# Patient Record
Sex: Female | Born: 1960 | Race: White | Hispanic: No | Marital: Married | Smoking: Never smoker
Health system: Southern US, Community
[De-identification: ages and names within clinical notes are randomized; demographics above are authoritative.]

## PROBLEM LIST (undated history)

## (undated) DIAGNOSIS — I10 Essential (primary) hypertension: Secondary | ICD-10-CM

## (undated) DIAGNOSIS — E119 Type 2 diabetes mellitus without complications: Secondary | ICD-10-CM

## (undated) HISTORY — PX: ABDOMINAL HYSTERECTOMY: SHX81

---

## 1998-06-01 ENCOUNTER — Other Ambulatory Visit: Admission: RE | Admit: 1998-06-01 | Discharge: 1998-06-01 | Payer: Self-pay | Admitting: Gynecology

## 1999-05-25 ENCOUNTER — Other Ambulatory Visit: Admission: RE | Admit: 1999-05-25 | Discharge: 1999-05-25 | Payer: Self-pay | Admitting: Gynecology

## 2001-06-12 ENCOUNTER — Other Ambulatory Visit: Admission: RE | Admit: 2001-06-12 | Discharge: 2001-06-12 | Payer: Self-pay | Admitting: Gynecology

## 2001-07-16 ENCOUNTER — Encounter: Payer: Self-pay | Admitting: Gynecology

## 2001-07-16 ENCOUNTER — Encounter: Admission: RE | Admit: 2001-07-16 | Discharge: 2001-07-16 | Payer: Self-pay | Admitting: Gynecology

## 2002-01-03 ENCOUNTER — Inpatient Hospital Stay (HOSPITAL_COMMUNITY): Admission: EM | Admit: 2002-01-03 | Discharge: 2002-01-04 | Payer: Self-pay | Admitting: Gynecology

## 2002-04-25 ENCOUNTER — Encounter: Payer: Self-pay | Admitting: Gynecology

## 2002-04-25 ENCOUNTER — Encounter: Admission: RE | Admit: 2002-04-25 | Discharge: 2002-04-25 | Payer: Self-pay | Admitting: Gynecology

## 2002-07-08 ENCOUNTER — Other Ambulatory Visit: Admission: RE | Admit: 2002-07-08 | Discharge: 2002-07-08 | Payer: Self-pay | Admitting: Gynecology

## 2002-07-22 ENCOUNTER — Encounter: Payer: Self-pay | Admitting: Gynecology

## 2002-07-22 ENCOUNTER — Encounter: Admission: RE | Admit: 2002-07-22 | Discharge: 2002-07-22 | Payer: Self-pay | Admitting: Gynecology

## 2003-07-29 ENCOUNTER — Other Ambulatory Visit: Admission: RE | Admit: 2003-07-29 | Discharge: 2003-07-29 | Payer: Self-pay | Admitting: Gynecology

## 2003-09-09 ENCOUNTER — Encounter: Admission: RE | Admit: 2003-09-09 | Discharge: 2003-09-09 | Payer: Self-pay | Admitting: Gynecology

## 2004-09-21 ENCOUNTER — Encounter: Admission: RE | Admit: 2004-09-21 | Discharge: 2004-09-21 | Payer: Self-pay | Admitting: Gynecology

## 2008-03-22 ENCOUNTER — Emergency Department: Payer: Self-pay | Admitting: Emergency Medicine

## 2011-01-04 ENCOUNTER — Other Ambulatory Visit: Payer: Self-pay | Admitting: *Deleted

## 2011-01-04 DIAGNOSIS — Z1231 Encounter for screening mammogram for malignant neoplasm of breast: Secondary | ICD-10-CM

## 2011-01-13 ENCOUNTER — Ambulatory Visit
Admission: RE | Admit: 2011-01-13 | Discharge: 2011-01-13 | Disposition: A | Discharge status: Home / Self Care | Payer: BLUE CROSS/BLUE SHIELD | Source: Ambulatory Visit | Attending: *Deleted | Admitting: *Deleted

## 2011-01-13 DIAGNOSIS — Z1231 Encounter for screening mammogram for malignant neoplasm of breast: Secondary | ICD-10-CM

## 2011-12-06 ENCOUNTER — Other Ambulatory Visit: Payer: Self-pay | Admitting: Family Medicine

## 2011-12-06 DIAGNOSIS — Z1231 Encounter for screening mammogram for malignant neoplasm of breast: Secondary | ICD-10-CM

## 2012-01-16 ENCOUNTER — Ambulatory Visit
Admission: RE | Admit: 2012-01-16 | Discharge: 2012-01-16 | Disposition: A | Discharge status: Home / Self Care | Payer: BC Managed Care – PPO | Source: Ambulatory Visit | Attending: Family Medicine | Admitting: Family Medicine

## 2012-01-16 DIAGNOSIS — Z1231 Encounter for screening mammogram for malignant neoplasm of breast: Secondary | ICD-10-CM

## 2012-10-09 ENCOUNTER — Other Ambulatory Visit: Payer: Self-pay | Admitting: Family Medicine

## 2012-10-09 DIAGNOSIS — Z1231 Encounter for screening mammogram for malignant neoplasm of breast: Secondary | ICD-10-CM

## 2013-01-22 ENCOUNTER — Ambulatory Visit
Admission: RE | Admit: 2013-01-22 | Discharge: 2013-01-22 | Disposition: A | Discharge status: Home / Self Care | Payer: PRIVATE HEALTH INSURANCE | Source: Ambulatory Visit | Attending: Family Medicine | Admitting: Family Medicine

## 2013-01-22 DIAGNOSIS — Z1231 Encounter for screening mammogram for malignant neoplasm of breast: Secondary | ICD-10-CM

## 2013-12-16 ENCOUNTER — Other Ambulatory Visit: Payer: Self-pay

## 2013-12-16 DIAGNOSIS — Z1231 Encounter for screening mammogram for malignant neoplasm of breast: Secondary | ICD-10-CM

## 2014-01-27 ENCOUNTER — Encounter (INDEPENDENT_AMBULATORY_CARE_PROVIDER_SITE_OTHER): Payer: Self-pay

## 2014-01-27 ENCOUNTER — Ambulatory Visit
Admission: RE | Admit: 2014-01-27 | Discharge: 2014-01-27 | Disposition: A | Discharge status: Home / Self Care | Payer: Self-pay | Source: Ambulatory Visit

## 2014-01-27 DIAGNOSIS — Z1231 Encounter for screening mammogram for malignant neoplasm of breast: Secondary | ICD-10-CM

## 2014-12-08 ENCOUNTER — Other Ambulatory Visit: Payer: Self-pay

## 2014-12-08 DIAGNOSIS — Z1231 Encounter for screening mammogram for malignant neoplasm of breast: Secondary | ICD-10-CM

## 2015-01-30 ENCOUNTER — Ambulatory Visit
Admission: RE | Admit: 2015-01-30 | Discharge: 2015-01-30 | Disposition: A | Discharge status: Home / Self Care | Payer: PRIVATE HEALTH INSURANCE | Source: Ambulatory Visit

## 2015-01-30 DIAGNOSIS — Z1231 Encounter for screening mammogram for malignant neoplasm of breast: Secondary | ICD-10-CM

## 2016-02-04 ENCOUNTER — Other Ambulatory Visit: Payer: Self-pay | Admitting: Nurse Practitioner

## 2016-02-04 DIAGNOSIS — Z1231 Encounter for screening mammogram for malignant neoplasm of breast: Secondary | ICD-10-CM

## 2016-02-08 ENCOUNTER — Ambulatory Visit: Admission: RE | Admit: 2016-02-08 | Payer: PRIVATE HEALTH INSURANCE | Source: Ambulatory Visit

## 2016-02-15 ENCOUNTER — Ambulatory Visit
Admission: RE | Admit: 2016-02-15 | Discharge: 2016-02-15 | Disposition: A | Discharge status: Home / Self Care | Payer: Managed Care, Other (non HMO) | Source: Ambulatory Visit | Attending: Nurse Practitioner | Admitting: Nurse Practitioner

## 2016-02-15 DIAGNOSIS — Z1231 Encounter for screening mammogram for malignant neoplasm of breast: Secondary | ICD-10-CM | POA: Diagnosis present

## 2017-12-29 ENCOUNTER — Other Ambulatory Visit: Payer: Self-pay | Admitting: Allergy

## 2017-12-29 ENCOUNTER — Ambulatory Visit
Admission: RE | Admit: 2017-12-29 | Discharge: 2017-12-29 | Disposition: A | Discharge status: Home / Self Care | Payer: No Typology Code available for payment source | Source: Ambulatory Visit | Attending: Allergy | Admitting: Allergy

## 2017-12-29 DIAGNOSIS — J209 Acute bronchitis, unspecified: Secondary | ICD-10-CM

## 2018-05-11 ENCOUNTER — Other Ambulatory Visit: Payer: Self-pay | Admitting: Nurse Practitioner

## 2018-05-11 DIAGNOSIS — R609 Edema, unspecified: Secondary | ICD-10-CM

## 2018-05-15 ENCOUNTER — Ambulatory Visit
Admission: RE | Admit: 2018-05-15 | Discharge: 2018-05-15 | Disposition: A | Discharge status: Home / Self Care | Payer: No Typology Code available for payment source | Source: Ambulatory Visit | Attending: Nurse Practitioner | Admitting: Nurse Practitioner

## 2018-05-15 DIAGNOSIS — R609 Edema, unspecified: Secondary | ICD-10-CM

## 2018-05-18 ENCOUNTER — Other Ambulatory Visit: Payer: No Typology Code available for payment source

## 2018-05-23 ENCOUNTER — Ambulatory Visit
Admission: EM | Admit: 2018-05-23 | Discharge: 2018-05-23 | Disposition: A | Discharge status: Home / Self Care | Payer: No Typology Code available for payment source | Attending: Emergency Medicine | Admitting: Emergency Medicine

## 2018-05-23 ENCOUNTER — Other Ambulatory Visit: Payer: Self-pay

## 2018-05-23 ENCOUNTER — Encounter: Payer: Self-pay | Admitting: Emergency Medicine

## 2018-05-23 ENCOUNTER — Ambulatory Visit (INDEPENDENT_AMBULATORY_CARE_PROVIDER_SITE_OTHER)
Admit: 2018-05-23 | Discharge: 2018-05-23 | Disposition: A | Discharge status: Home / Self Care | Payer: No Typology Code available for payment source | Attending: Emergency Medicine | Admitting: Emergency Medicine

## 2018-05-23 DIAGNOSIS — N200 Calculus of kidney: Secondary | ICD-10-CM | POA: Diagnosis not present

## 2018-05-23 DIAGNOSIS — R000 Tachycardia, unspecified: Secondary | ICD-10-CM

## 2018-05-23 DIAGNOSIS — M545 Low back pain: Secondary | ICD-10-CM | POA: Diagnosis not present

## 2018-05-23 DIAGNOSIS — J90 Pleural effusion, not elsewhere classified: Secondary | ICD-10-CM | POA: Diagnosis not present

## 2018-05-23 HISTORY — DX: Essential (primary) hypertension: I10

## 2018-05-23 HISTORY — DX: Type 2 diabetes mellitus without complications: E11.9

## 2018-05-23 LAB — URINALYSIS, COMPLETE (UACMP) WITH MICROSCOPIC
BACTERIA UA: NONE SEEN
Bilirubin Urine: NEGATIVE
Glucose, UA: 500 mg/dL — AB
Hgb urine dipstick: NEGATIVE
KETONES UR: NEGATIVE mg/dL
LEUKOCYTES UA: NEGATIVE
Nitrite: NEGATIVE
PH: 5 (ref 5.0–8.0)
PROTEIN: NEGATIVE mg/dL
Specific Gravity, Urine: 1.01 (ref 1.005–1.030)
WBC, UA: NONE SEEN WBC/hpf (ref 0–5)

## 2018-05-23 MED ORDER — IBUPROFEN 600 MG PO TABS: 600.0000 mg | ORAL_TABLET | Freq: Four times a day (QID) | ORAL | 0 refills | Status: AC | PRN

## 2018-05-23 MED ORDER — TAMSULOSIN HCL 0.4 MG PO CAPS: 0.4000 mg | ORAL_CAPSULE | Freq: Every day | ORAL | 0 refills | Status: AC

## 2018-05-23 NOTE — ED Provider Notes (Signed)
HPI  SUBJECTIVE:  Holly Olson is a 57 y.o. female who presents with 2 weeks of intermittent left low back pain.  She describes it as sharp, stabbing.  It completely resolved yesterday but returned last night.  States that it started initially in the mid axillary line and now has migrated to her medial back.  It otherwise does not migrate or radiate.  No fevers, nausea, vomiting.  No dysuria, urgency, frequency, cloudy or odorous urine, hematuria, low she states that she passed 2 black specks in her urine yesterday.  No trauma to the back.  No recent heavy lifting.  No saddle anesthesia, fecal urinary incontinence, urinary retention.  No sciatic pain, radicular pain, leg weakness.  She states that this pain is identical to previous nonobstructing nephrolithiasis.  She has tried hot showers, heating pads, ice, pushing fluids, 2 Aleve.  The heat and ice seem to help the most.  Symptoms are worse with bending forward, coughing and torso rotation.  She has a past medical history of diabetes, hypertension, nonobstructing left-sided nephrolithiasis.  She states that her heart rate is "always fast".  States that her baseline is 98-100.  No history of kidney disease.  Family history significant for brother with nephrolithiasis.  PMD: Marshia LyMaloney, Rhonda L, NP.  Urology: None.   Past Medical History:  Diagnosis Date  . Diabetes mellitus without complication (HCC)   . Hypertension     Past Surgical History:  Procedure Laterality Date  . ABDOMINAL HYSTERECTOMY      Family History  Problem Relation Age of Onset  . Breast cancer Maternal Grandmother 4780    Social History   Tobacco Use  . Smoking status: Never Smoker  . Smokeless tobacco: Never Used  Substance Use Topics  . Alcohol use: Never    Frequency: Never  . Drug use: Never    No current facility-administered medications for this encounter.   Current Outpatient Medications:  .  aspirin EC 81 MG tablet, Take 81 mg by mouth daily., Disp: ,  Rfl:  .  atorvastatin (LIPITOR) 10 MG tablet, Take 10 mg by mouth daily., Disp: , Rfl:  .  benazepril (LOTENSIN) 10 MG tablet, Take 10 mg by mouth daily., Disp: , Rfl:  .  budesonide-formoterol (SYMBICORT) 160-4.5 MCG/ACT inhaler, Inhale 2 puffs into the lungs 2 (two) times daily., Disp: , Rfl:  .  Empagliflozin-metFORMIN HCl (SYNJARDY) 12.01-999 MG TABS, Take 1 tablet by mouth daily., Disp: , Rfl:  .  glyBURIDE (DIABETA) 5 MG tablet, Take 10 mg by mouth daily with breakfast., Disp: , Rfl:  .  hydrochlorothiazide (HYDRODIURIL) 25 MG tablet, Take 12.5 mg by mouth daily., Disp: , Rfl:  .  loratadine (CLARITIN) 10 MG tablet, Take 10 mg by mouth daily., Disp: , Rfl:  .  montelukast (SINGULAIR) 10 MG tablet, Take 10 mg by mouth at bedtime., Disp: , Rfl:  .  Multiple Vitamin (MULTIVITAMIN) tablet, Take 1 tablet by mouth daily., Disp: , Rfl:  .  ibuprofen (ADVIL,MOTRIN) 600 MG tablet, Take 1 tablet (600 mg total) by mouth every 6 (six) hours as needed., Disp: 30 tablet, Rfl: 0 .  tamsulosin (FLOMAX) 0.4 MG CAPS capsule, Take 1 capsule (0.4 mg total) by mouth at bedtime for 7 days., Disp: 7 capsule, Rfl: 0  No Known Allergies   ROS  As noted in HPI.   Physical Exam  BP (!) 148/95 (BP Location: Left Arm)   Pulse (!) 120   Temp 98.4 F (36.9 C) (Oral)  Resp 16   Ht 5\' 2"  (1.575 m)   Wt 71.7 kg   SpO2 98%   BMI 28.90 kg/m    Vitals:   05/23/18 0850 05/23/18 0855 05/23/18 1203  BP:  (!) 148/95   Pulse:  (!) 120 (!) 120  Resp:  16   Temp:  98.4 F (36.9 C)   TempSrc:  Oral   SpO2:  97% 98%  Weight: 71.7 kg    Height: 5\' 2"  (1.575 m)      Constitutional: Well developed, well nourished, no acute distress Eyes:  EOMI, conjunctiva normal bilaterally HENT: Normocephalic, atraumatic,mucus membranes moist Respiratory: Normal inspiratory effort Cardiovascular: Regular tachycardia GI: Soft, nontender, nondistended, no rebound, guarding. No suprapubic, flank tenderness skin: No rash,  skin intact Musculoskeletal: no CVAT. - paralumbar tenderness, - muscle spasm. No bony tenderness. Bilateral lower extremities nontender without new rashes or color change, baseline ROM with intact  PT pulses,  No pain with int/ext rotation flex/extension hips bilaterally. SLR neg bilaterally. Sensation baseline light touch bilaterally for Pt, DTR's symmetric and intact bilaterally KJ / AJ, Motor symmetric bilateral 5/5 hip flexion, quadriceps, hamstrings, EHL, foot dorsiflexion, foot plantarflexion, gait normal.  Neurologic: Alert & oriented x 3, no focal neuro deficits Psychiatric: Speech and behavior appropriate   ED Course   Medications - No data to display  Orders Placed This Encounter  Procedures  . US Renal    Standing Status:   Standing    Number of Occurrences:   1    Order Specific Question:   Reason for Exam (SYMPTOM  OR DIAGNOSIS REQUIRED)    Answer:   h/o nephrolithiasis, has L low back pain x 1-2 weeks. r/o pbstructing stone, hydronephrosis  . Urinalysis, Complete w Microscopic    Standing Status:   Standing    Number of Occurrences:   1  . Recheck vitals    Standing Status:   Standing    Number of Occurrences:   1    Results for orders placed or performed during the hospital encounter of 05/23/18 (from the past 24 hour(s))  Urinalysis, Complete w Microscopic     Status: Abnormal   Collection Time: 05/23/18  8:56 AM  Result Value Ref Range   Color, Urine YELLOW YELLOW   APPearance HAZY (A) CLEAR   Specific Gravity, Urine 1.010 1.005 - 1.030   pH 5.0 5.0 - 8.0   Glucose, UA 500 (A) NEGATIVE mg/dL   Hgb urine dipstick NEGATIVE NEGATIVE   Bilirubin Urine NEGATIVE NEGATIVE   Ketones, ur NEGATIVE NEGATIVE mg/dL   Protein, ur NEGATIVE NEGATIVE mg/dL   Nitrite NEGATIVE NEGATIVE   Leukocytes, UA NEGATIVE NEGATIVE   Squamous Epithelial / LPF 0-5 0 - 5   WBC, UA NONE SEEN 0 - 5 WBC/hpf   RBC / HPF 0-5 0 - 5 RBC/hpf   Bacteria, UA NONE SEEN NONE SEEN   [place]  Renal  Result Date: 05/23/2018 CLINICAL DATA:  Low back pain for the past 1-2 weeks. History of nephrolithiasis. EXAM: RENAL / URINARY TRACT ULTRASOUND COMPLETE COMPARISON:  CT abdomen pelvis dated March 22, 2008. FINDINGS: Right Kidney: Length: 10.8 cm. Echogenicity within normal limits. No mass or hydronephrosis visualized. Left Kidney: Length: 10.8 cm. Echogenicity within normal limits. Mild hydronephrosis. 7 mm echogenic focus in the midpole. Bladder: Appears normal for degree of bladder distention. Incidental note is made of a small left pleural effusion. IMPRESSION: 1. Mild left hydronephrosis. This could be related to an obstructing ureteral calculus not visualized  by ultrasound. Consider noncontrast CT of the abdomen and pelvis for further evaluation. 2. 7 mm echogenic focus in the left kidney may represent a nonobstructive calculus. 3. Small left pleural effusion. Electronically Signed   By: Obie Dredge M.D.   On: 05/23/2018 11:29    ED Clinical Impression  Left nephrolithiasis  Tachycardia  Pleural effusion   ED Assessment/Plan  UA negative for UTI hematuria, crystals.  She has glucosuria, but she is a known diabetic.  Tachycardia noted.  She states that her heart rate is "always fast", up into the 120s at doctor's visits.  She denies pleuritic pain, shortness of breath, dyspnea on exertion.  She is afebrile, has not taken any antipyretic in the past 4 to 6 hours, since.  She declined any pain medicine.  No evidence of spinal cord involvement based on H&P. Pt describing typical back pain, has been < 6 week duration. No historical red flags as noted in HPI. No physical red flags such as fever, bony tenderness, lower extremity weakness, saddle anesthesia.   Will obtain renal ultrasound to evaluate for obstructing nephrolithiasis, hydronephrosis.  TEE not available in clinic today.  If negative, suspect musculoskeletal cause.    Reviewed ultrasound report.  Mild left hydronephrosis.   7 mm echogenic focus in the left kidney which may represent a nonobstructive calculus.  Small left pleural effusion.  See radiology report for details.  Discussed ultrasound findings with the patient, mild left hydronephrosis and 7 mm nonobstructing stone in the left kidney.  Discussed with her that she could have an obstructing stone further down, but she has opted to try medical management, she appears nontoxic, comfortable and her vitals are normal other than tachycardia.  Also discussed the pleural effusion.  This could be from her recent bout of bronchitis however discussed with her that in between the unexplained tachycardia and pleural effusion, there is a concern for a PE, offered to do chest x-ray and d-dimer, however, patient has opted to keep an eye on this and will follow-up with her primary care physician regarding the pleural effusion.  She states that she is always tachycardic up into the 120s at doctor's offices.  Gave her very strict ER return precautions.  Will send home with a strainer, flomax, ibuprofen/Tylenol combination, advised follow up with Dr. Apolinar Junes urology on call as needed.  Strict ER return precautions  Discussed labs, imaging, medical decision-making, and plan for follow-up with the patient.  Discussed signs and symptoms that should prompt return to the emergency department.  Patient agrees with plan. .   Meds ordered this encounter  Medications  . ibuprofen (ADVIL,MOTRIN) 600 MG tablet    Sig: Take 1 tablet (600 mg total) by mouth every 6 (six) hours as needed.    Dispense:  30 tablet    Refill:  0  . tamsulosin (FLOMAX) 0.4 MG CAPS capsule    Sig: Take 1 capsule (0.4 mg total) by mouth at bedtime for 7 days.    Dispense:  7 capsule    Refill:  0    *This clinic note was created using Scientist, clinical (histocompatibility and immunogenetics). Therefore, there may be occasional mistakes despite careful proofreading.  ?    Domenick Gong, MD 05/23/18 [phone]

## 2018-05-23 NOTE — Discharge Instructions (Addendum)
600 mg ibuprofen with 1 g of Tylenol 3 or 4 times a day as needed for pain.  Continue pushing fluids.  Go immediately to the ER for fevers above 100.4, severe back pain, back swelling, if you feel that you cannot completely empty her bladder, or for any other concerns.  You also need to follow-up with your doctor regarding your fast heart rate and the small left pleural effusion.  Go immediately to the ER for chest pain, particularly sharp pain with inspiration, shortness of breath, coughing up blood, or for the signs and symptoms we discussed.

## 2018-05-23 NOTE — ED Triage Notes (Signed)
Patient c/o left sided lower back pain a week ago.

## 2018-11-30 ENCOUNTER — Ambulatory Visit
Admission: RE | Admit: 2018-11-30 | Discharge: 2018-11-30 | Disposition: A | Discharge status: Home / Self Care | Payer: No Typology Code available for payment source | Source: Ambulatory Visit | Attending: Nurse Practitioner | Admitting: Nurse Practitioner

## 2018-11-30 ENCOUNTER — Other Ambulatory Visit: Payer: Self-pay | Admitting: Nurse Practitioner

## 2018-11-30 DIAGNOSIS — J90 Pleural effusion, not elsewhere classified: Secondary | ICD-10-CM

## 2018-11-30 DIAGNOSIS — M25512 Pain in left shoulder: Secondary | ICD-10-CM

## 2020-11-09 ENCOUNTER — Ambulatory Visit
Admission: EM | Admit: 2020-11-09 | Discharge: 2020-11-09 | Disposition: A | Discharge status: Home / Self Care | Payer: No Typology Code available for payment source

## 2020-11-09 ENCOUNTER — Other Ambulatory Visit: Payer: Self-pay

## 2020-11-09 DIAGNOSIS — R432 Parageusia: Secondary | ICD-10-CM | POA: Diagnosis not present

## 2020-11-09 NOTE — ED Triage Notes (Signed)
Pt states she was started on a new diabetes medication and recently increased the dose which caused blisters in her mouth. Pt states she has stopped taking the medication a week ago but has continued her other diabetes medication. She has tried to get ahold of her doctor but unable to reach. No one calls her back.

## 2020-11-09 NOTE — Discharge Instructions (Addendum)
Continue to hold your Rybelsus.  Try using a water flavor enhancer such as mio to improve on the taste of the water so that is easier for you to consume.  You can try drinking Glucerna shakes to augment the food you are taking and if you feel you are not getting enough nutrients.  I have made a referral to her PCP assistant service to help you find a primary care provider closer to home.

## 2020-11-09 NOTE — ED Provider Notes (Signed)
[facility]    CSN: [id number] Arrival date & time: 11/09/20  1504      History   Chief Complaint Chief Complaint  Patient presents with  . Medication Reaction    HPI Holly Olson is a 60 y.o. female.   HPI   60 year old female here for evaluation of mouth blisters.  Patient reports that she was started on Rybelsus by her PCP because he wanted tighter glycemic control.  She recently increased to 7 mg and then developed blisters on her tongue.  She stopped taking the Rybelsus 1 week ago.  Patient reports that she has been using Magic mouthwash and warm salt water to help heal the blisters and they have improved.  Patient's primary complaint right now is that water taste better and is hard for her to get enough fluid by mouth.  Past Medical History:  Diagnosis Date  . Diabetes mellitus without complication (HCC)   . Hypertension     There are no problems to display for this patient.   Past Surgical History:  Procedure Laterality Date  . ABDOMINAL HYSTERECTOMY      OB History   No obstetric history on file.      Home Medications    Prior to Admission medications   Medication Sig Start Date End Date Taking? Authorizing Provider  atorvastatin (LIPITOR) 10 MG tablet Take 10 mg by mouth daily.    [provider]  benazepril (LOTENSIN) 10 MG tablet Take 10 mg by mouth daily.    [provider]  Empagliflozin-metFORMIN HCl (SYNJARDY) 12.01-999 MG TABS Take 1 tablet by mouth daily.    [provider]  glyBURIDE (DIABETA) 5 MG tablet Take 10 mg by mouth daily with breakfast.    [provider]  hydrochlorothiazide (HYDRODIURIL) 25 MG tablet Take 12.5 mg by mouth daily.    [provider]  ibuprofen (ADVIL,MOTRIN) 600 MG tablet Take 1 tablet (600 mg total) by mouth every 6 (six) hours as needed. 05/23/18   Domenick Gong, MD  loratadine (CLARITIN) 10 MG tablet Take 10 mg by mouth daily.    [provider]  montelukast (SINGULAIR) 10 MG tablet Take 10 mg by mouth at bedtime.    [provider]  Multiple Vitamin (MULTIVITAMIN) tablet Take 1 tablet by mouth daily.    [provider]  RYBELSUS 7 MG TABS TAKE 1 TABLET (7 MG) ONCE DAILY. ADMINISTER 30 MINUTES BEFORE THAT FIRST FOOD, BEVERAGE OR OTHER MEDICATIONS OF THE DAY - PATIENT NEEDS TO SCHEDULE AN ANNUAL EXAM WITH OUR OFFICE 10/18/20   [provider]  budesonide-formoterol (SYMBICORT) 160-4.5 MCG/ACT inhaler Inhale 2 puffs into the lungs 2 (two) times daily.  11/09/20  [provider]    Family History Family History  Problem Relation Age of Onset  . Breast cancer Maternal Grandmother 35    Social History Social History   Tobacco Use  . Smoking status: Never Smoker  . Smokeless tobacco: Never Used  Vaping Use  . Vaping Use: Never used  Substance Use Topics  . Alcohol use: Never  . Drug use: Never     Allergies   Latex   Review of Systems Review of Systems  Constitutional: Negative for activity change, appetite change and fever.  HENT: Positive for mouth sores.   Gastrointestinal: Negative for nausea and vomiting.  Skin: Negative for rash.  Hematological: Negative.   Psychiatric/Behavioral: Negative.      Physical Exam Triage Vital Signs ED Triage Vitals  Enc Vitals Group     BP 11/09/20 1523 (!) 156/98     Pulse Rate 11/09/20 1523 (!) 120     Resp 11/09/20 1523 18     Temp 11/09/20 1523 (!) 97 F (36.1 C)     Temp Source 11/09/20 1523 Temporal     SpO2 11/09/20 1523 100 %     Weight 11/09/20 1522 125 lb (56.7 kg)     Height 11/09/20 1522 5\' 3"  (1.6 m)     Head Circumference --      Peak Flow --      Pain Score 11/09/20 1522 0     Pain Loc --      Pain Edu? --      Excl. in GC? --    No data found.  Updated Vital Signs BP (!) 156/98 (BP Location: Left Arm)   Pulse (!) 120   Temp (!) 97 F (36.1 C) (Temporal)   Resp 18   Ht 5\' 3"  (1.6 m)   Wt 125 lb (56.7 kg)    SpO2 100%   BMI 22.14 kg/m   Visual Acuity Right Eye Distance:   Left Eye Distance:   Bilateral Distance:    Right Eye Near:   Left Eye Near:    Bilateral Near:     Physical Exam Vitals and nursing note reviewed.  Constitutional:      General: She is not in acute distress.    Appearance: Normal appearance. She is not ill-appearing.  HENT:     Head: Normocephalic and atraumatic.     Mouth/Throat:     Mouth: Mucous membranes are moist.     Pharynx: Oropharynx is clear. Posterior oropharyngeal erythema present.     Comments: Patient's tongue is erythematous on the left lateral border.  There are no active ulcers or blisters at present. Cardiovascular:     Rate and Rhythm: Normal rate and regular rhythm.     Pulses: Normal pulses.     Heart sounds: Normal heart sounds. No murmur heard. No gallop.   Pulmonary:     Effort: Pulmonary effort is normal.     Breath sounds: Normal breath sounds. No wheezing, rhonchi or rales.  Skin:    General: Skin is warm and dry.     Capillary Refill: Capillary refill takes less than 2 seconds.     Findings: No erythema or rash.  Neurological:     General: No focal deficit present.     Mental Status: She is alert and oriented to person, place, and time.  Psychiatric:        Mood and Affect: Mood normal.        Behavior: Behavior normal.        Thought Content: Thought content normal.        Judgment: Judgment normal.      UC Treatments / Results  Labs (all labs ordered are listed, but only abnormal results are displayed) Labs Reviewed - No data to display  EKG   Radiology No results found.  Procedures Procedures (including critical care time)  Medications Ordered in UC Medications - No data to display  Initial Impression / Assessment and Plan / UC Course  I have reviewed the triage vital signs and the nursing notes.  Pertinent labs & imaging results that were available during my care of the patient were reviewed by me and  considered in my medical decision making (see chart for details).   Patient is here for evaluation of mouth  blisters that she thinks are a result of Rybelsus.  Patient has no current blisters present in her mouth though the left lateral aspect of her tongue is erythematous.  Patient has been using Magic mouthwash at home but is almost out.  Will refill patient's Magic mouthwash.  Patient's primary complaint is that she feels like she is not getting enough nutrients and that water taste better to her so is hard for her to drink enough fluid.  Suggested the patient that she use a water enhancer to alter the flavor of her water and see if it improves the bitter taste as well as trying Glucerna shakes to help increase her protein and caloric intake.  Patient is also looking for a new primary care provider as she works in [place], [place] in [place], and her PCP is in [place].  Will refer her to the PCP management group.   Final Clinical Impressions(s) / UC Diagnoses   Final diagnoses:  Abnormal sense of taste     Discharge Instructions     Continue to hold your Rybelsus.  Try using a water flavor enhancer such as mio to improve on the taste of the water so that is easier for you to consume.  You can try drinking Glucerna shakes to augment the food you are taking and if you feel you are not getting enough nutrients.  I have made a referral to her PCP assistant service to help you find a primary care provider closer to home.    ED Prescriptions    None     PDMP not reviewed this encounter.   Becky Augusta, NP 11/09/20 1615

## 2020-11-10 ENCOUNTER — Telehealth: Payer: Self-pay | Admitting: Emergency Medicine

## 2020-11-10 MED ORDER — MAGIC MOUTHWASH: 5.0000 mL | Freq: Four times a day (QID) | ORAL | 0 refills | Status: DC

## 2020-11-10 MED ORDER — MAGIC MOUTHWASH: 5.0000 mL | Freq: Four times a day (QID) | ORAL | 0 refills | Status: AC

## 2020-11-10 NOTE — Addendum Note (Signed)
Addended by: Shelby Dubin on: 11/10/2020 06:37 PM   Modules accepted: Orders

## 2020-11-10 NOTE — Addendum Note (Signed)
Addended by: Shelby Dubin on: 11/10/2020 06:43 PM   Modules accepted: Orders

## 2020-11-10 NOTE — Telephone Encounter (Signed)
Patient here for evaluation of mouth ulcers and has been using Magic mouthwash at home.  Patient called back to report that she is almost out of her old supply and is requesting a new prescription.  Prescription sent to the pharmacy.

## 2021-10-19 ENCOUNTER — Other Ambulatory Visit: Payer: Self-pay | Admitting: Physician Assistant

## 2021-10-19 ENCOUNTER — Other Ambulatory Visit: Payer: Self-pay | Admitting: Nurse Practitioner

## 2021-10-19 DIAGNOSIS — Z1231 Encounter for screening mammogram for malignant neoplasm of breast: Secondary | ICD-10-CM

## 2021-10-29 ENCOUNTER — Ambulatory Visit
Admission: RE | Admit: 2021-10-29 | Discharge: 2021-10-29 | Disposition: A | Discharge status: Home / Self Care | Payer: No Typology Code available for payment source | Source: Ambulatory Visit | Attending: Physician Assistant | Admitting: Physician Assistant

## 2021-10-29 DIAGNOSIS — Z1231 Encounter for screening mammogram for malignant neoplasm of breast: Secondary | ICD-10-CM

## 2022-11-30 ENCOUNTER — Other Ambulatory Visit: Payer: Self-pay | Admitting: Nurse Practitioner

## 2022-11-30 DIAGNOSIS — Z1231 Encounter for screening mammogram for malignant neoplasm of breast: Secondary | ICD-10-CM

## 2023-01-17 ENCOUNTER — Ambulatory Visit
Admission: RE | Admit: 2023-01-17 | Discharge: 2023-01-17 | Disposition: A | Discharge status: Home / Self Care | Payer: Managed Care, Other (non HMO) | Source: Ambulatory Visit | Attending: Nurse Practitioner | Admitting: Nurse Practitioner

## 2023-01-17 DIAGNOSIS — Z1231 Encounter for screening mammogram for malignant neoplasm of breast: Secondary | ICD-10-CM

## 2023-10-20 ENCOUNTER — Other Ambulatory Visit: Payer: Self-pay | Admitting: Nurse Practitioner

## 2023-10-20 DIAGNOSIS — Z1231 Encounter for screening mammogram for malignant neoplasm of breast: Secondary | ICD-10-CM

## 2023-11-08 IMAGING — MG MM DIGITAL SCREENING BILAT W/ TOMO AND CAD
6 of 10 series · 6 of 30 positions shown · non-contrast
Comparison: Previous exam(s).

CLINICAL DATA: Screening.

EXAM:
DIGITAL SCREENING BILATERAL MAMMOGRAM WITH TOMOSYNTHESIS AND CAD
TECHNIQUE: Bilateral screening digital craniocaudal and mediolateral oblique
mammograms were obtained. Bilateral screening digital breast
tomosynthesis was performed. The images were evaluated with
computer-aided detection.

[R MLO synth-2D]
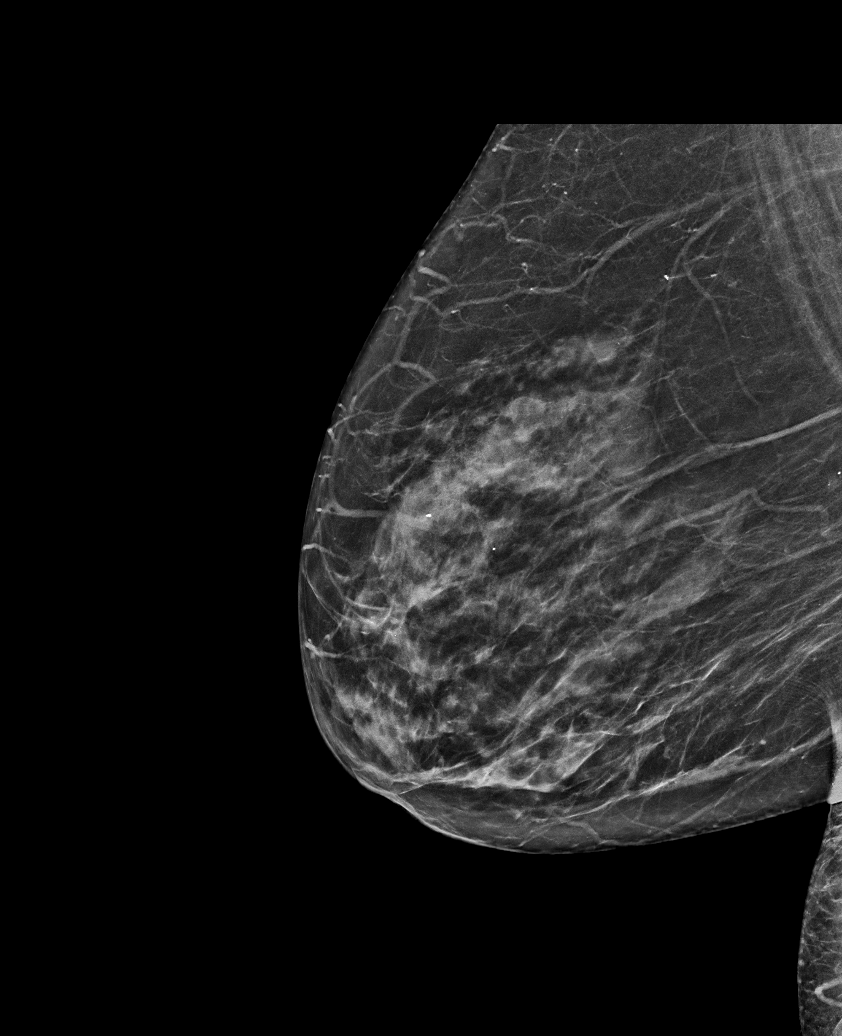

[R CC synth-2D]
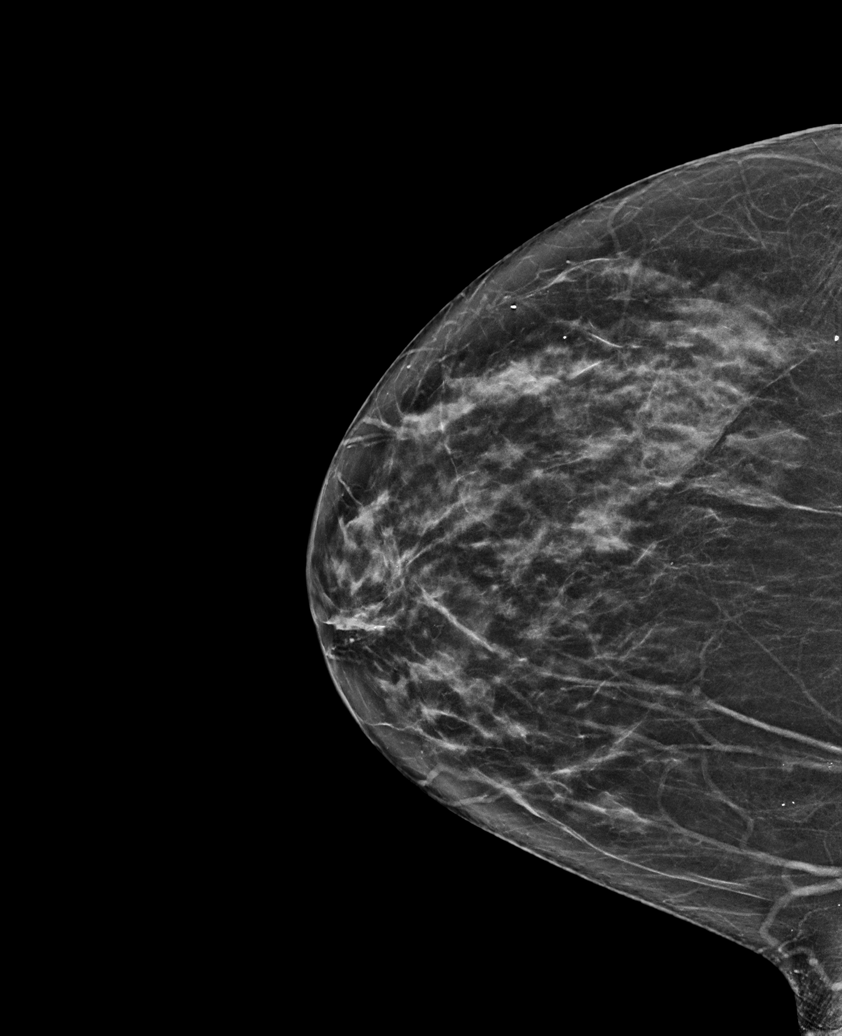

[L CC synth-2D]
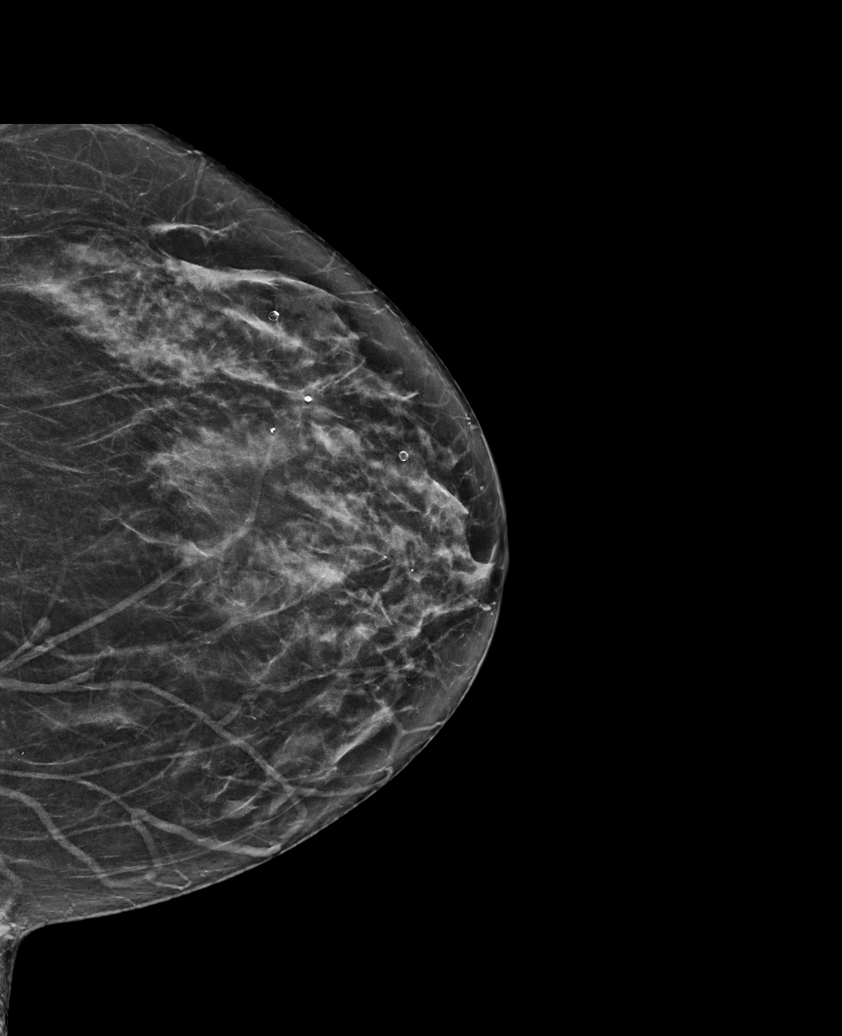

[L MLO synth-2D (1 of 2)]
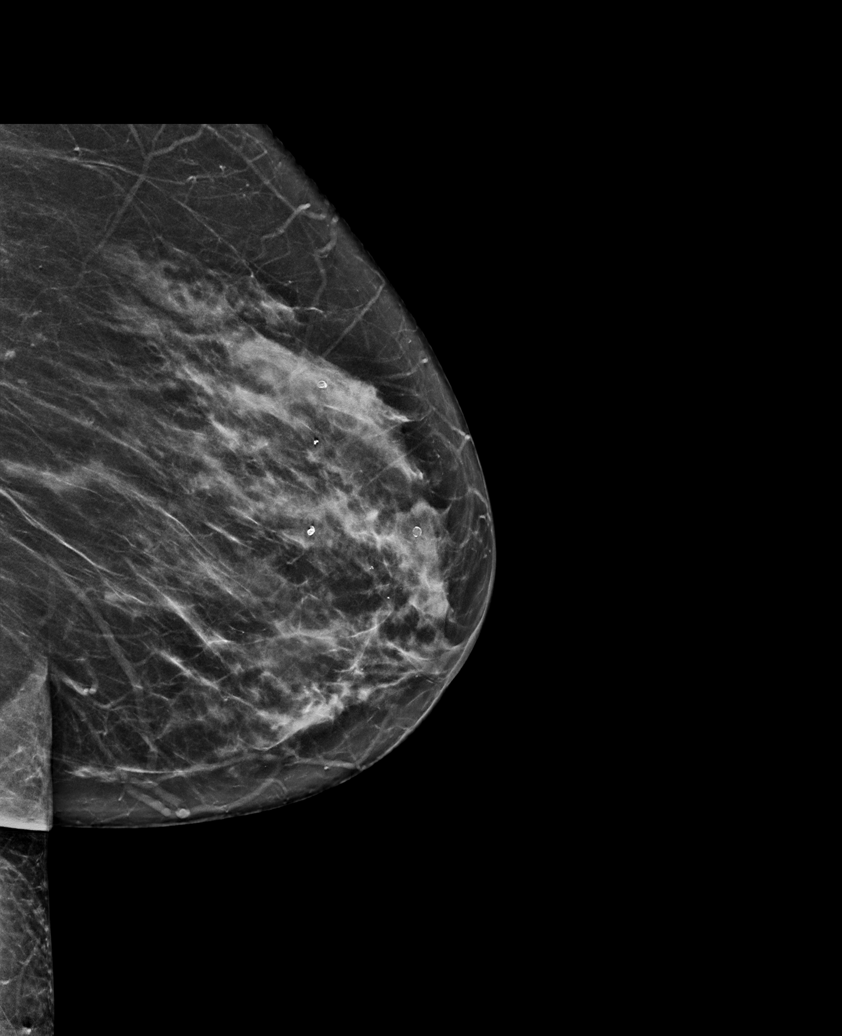

[L MLO synth-2D (2 of 2)]
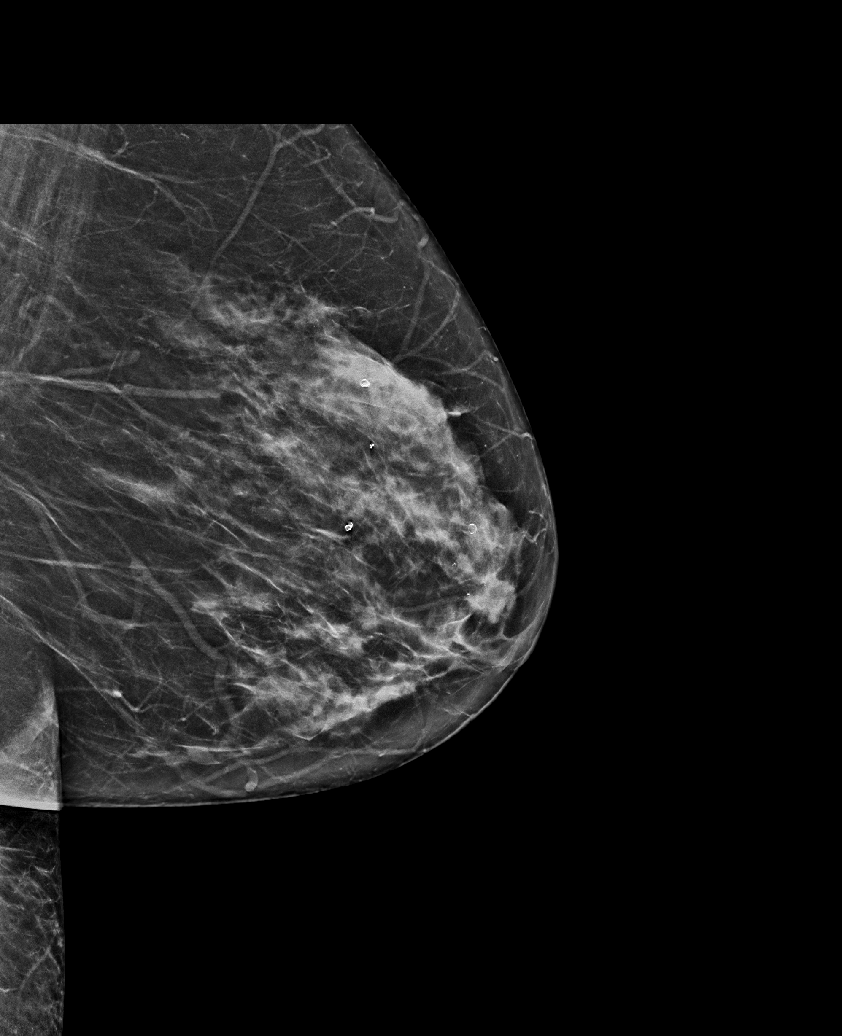

[R CC tomo · tomo slice 31/60.0]
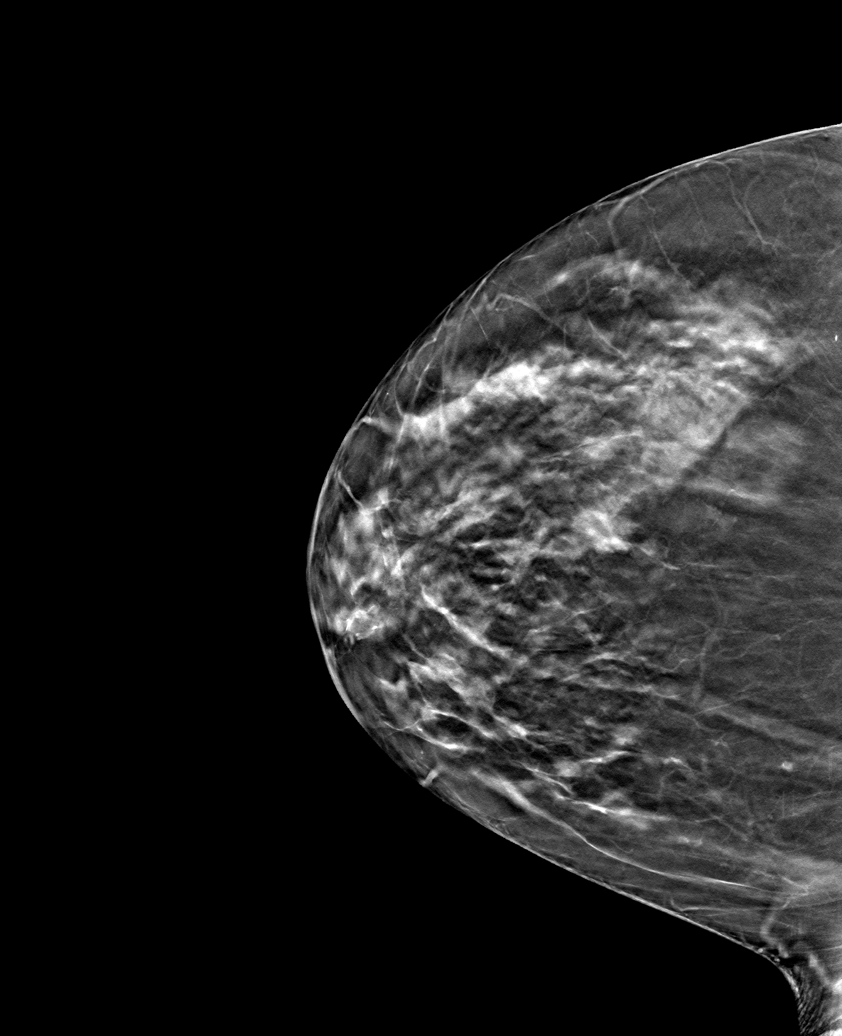

[6 of 30 positions shown; findings below may reference images not displayed]

ACR Breast Density Category c: The breast tissue is heterogeneously
dense, which may obscure small masses.
FINDINGS: There are no findings suspicious for malignancy.
IMPRESSION: No mammographic evidence of malignancy. A result letter of this
screening mammogram will be mailed directly to the patient.

RECOMMENDATION:
Screening mammogram in one year. (Code:Q3-W-BC3)

BI-RADS CATEGORY  1: Negative.

## 2024-01-18 ENCOUNTER — Ambulatory Visit
Admission: RE | Admit: 2024-01-18 | Discharge: 2024-01-18 | Disposition: A | Discharge status: Home / Self Care | Payer: Managed Care, Other (non HMO) | Source: Ambulatory Visit | Attending: Nurse Practitioner | Admitting: Nurse Practitioner

## 2024-01-18 DIAGNOSIS — Z1231 Encounter for screening mammogram for malignant neoplasm of breast: Secondary | ICD-10-CM
